# Patient Record
Sex: Female | Born: 1945 | Hispanic: Yes | Marital: Single
Health system: Southern US, Community
[De-identification: ages and names within clinical notes are randomized; demographics above are authoritative.]

---

## 2015-11-03 ENCOUNTER — Emergency Department: Payer: PRIVATE HEALTH INSURANCE

## 2015-11-03 ENCOUNTER — Emergency Department
Admission: EM | Admit: 2015-11-03 | Discharge: 2015-11-03 | Disposition: A | Discharge status: Home / Self Care | Payer: PRIVATE HEALTH INSURANCE | Attending: Emergency Medicine | Admitting: Emergency Medicine

## 2015-11-03 DIAGNOSIS — R05 Cough: Secondary | ICD-10-CM

## 2015-11-03 DIAGNOSIS — R739 Hyperglycemia, unspecified: Secondary | ICD-10-CM | POA: Insufficient documentation

## 2015-11-03 DIAGNOSIS — R059 Cough, unspecified: Secondary | ICD-10-CM

## 2015-11-03 LAB — COMPREHENSIVE METABOLIC PANEL
ALK PHOS: 85 U/L (ref 38–126)
ALT: 33 U/L (ref 14–54)
AST: 28 U/L (ref 15–41)
Albumin: 4.4 g/dL (ref 3.5–5.0)
Anion gap: 7 (ref 5–15)
BUN: 18 mg/dL (ref 6–20)
CALCIUM: 10.7 mg/dL — AB (ref 8.9–10.3)
CHLORIDE: 105 mmol/L (ref 101–111)
CO2: 27 mmol/L (ref 22–32)
CREATININE: 0.63 mg/dL (ref 0.44–1.00)
Glucose, Bld: 252 mg/dL — ABNORMAL HIGH (ref 65–99)
Potassium: 4.6 mmol/L (ref 3.5–5.1)
SODIUM: 139 mmol/L (ref 135–145)
Total Bilirubin: 0.7 mg/dL (ref 0.3–1.2)
Total Protein: 8.4 g/dL — ABNORMAL HIGH (ref 6.5–8.1)

## 2015-11-03 LAB — CBC
HCT: 40.6 % (ref 35.0–47.0)
HEMOGLOBIN: 14.2 g/dL (ref 12.0–16.0)
MCH: 30.7 pg (ref 26.0–34.0)
MCHC: 34.8 g/dL (ref 32.0–36.0)
MCV: 88.1 fL (ref 80.0–100.0)
PLATELETS: 256 10*3/uL (ref 150–440)
RBC: 4.62 MIL/uL (ref 3.80–5.20)
RDW: 12.8 % (ref 11.5–14.5)
WBC: 8 10*3/uL (ref 3.6–11.0)

## 2015-11-03 LAB — TROPONIN I: Troponin I: <0.03 ng/mL (ref <0.03)

## 2015-11-03 MED ORDER — HYDROCOD POLST-CPM POLST ER 10-8 MG/5ML PO SUER
5.0000 mL | Freq: Once | ORAL | Status: AC
  Administered 2015-11-03: 5 mL via ORAL
  Filled 2015-11-03: qty 5

## 2015-11-03 MED ORDER — AZITHROMYCIN 250 MG PO TABS: ORAL_TABLET | ORAL | Status: AC

## 2015-11-03 MED ORDER — HYDROCOD POLST-CPM POLST ER 10-8 MG/5ML PO SUER: 5.0000 mL | Freq: Two times a day (BID) | ORAL | Status: AC

## 2015-11-03 MED ORDER — METFORMIN HCL 500 MG PO TABS: 500.0000 mg | ORAL_TABLET | Freq: Two times a day (BID) | ORAL | Status: AC

## 2015-11-03 NOTE — ED Provider Notes (Addendum)
[facility] Emergency Department Provider Note        Time seen: ----------------------------------------- 2:11 PM on 11/03/2015 -----------------------------------------    I have reviewed the triage vital signs and the nursing notes.   HISTORY  Chief Complaint Cough    HPI Sarah Mejia is a 70 y.o. female who presents to ER for persistent cough for the last 2-1/2 months. Patient reportedly just moved here from [place] she's been feeling sick since she arrived. She denies fever but has had cough and congestion. She's been taking Mucinex, occasionally has white sputum. Patient states it hurts to cough and breathe, reports congestion. She denies hemoptysis. She denies other complaints at this time.   No past medical history on file.  There are no active problems to display for this patient.   No past surgical history on file.  Allergies Review of patient's allergies indicates no known allergies.  Social History Social History  Substance Use Topics  . Smoking status: Not on file  . Smokeless tobacco: Not on file  . Alcohol Use: Not on file    Review of Systems Constitutional: Negative for fever. Eyes: Negative for visual changes. ENT: Negative for sore throat.Positive for congestion Cardiovascular: Negative for chest pain. Respiratory: Positive for cough Gastrointestinal: Negative for abdominal pain, vomiting and diarrhea. Genitourinary: Negative for dysuria. Musculoskeletal: Negative for back pain. Skin: Negative for rash. Neurological: Negative for headaches, focal weakness or numbness.  10-point ROS otherwise negative.  ____________________________________________   PHYSICAL EXAM:  VITAL SIGNS: ED Triage Vitals  Enc Vitals Group     BP 11/03/15 1121 147/93 mmHg     Pulse Rate 11/03/15 1121 99     Resp 11/03/15 1121 20     Temp 11/03/15 1121 98.1 F (36.7 C)     Temp Source 11/03/15 1121 Oral     SpO2 11/03/15  1121 95 %     Weight 11/03/15 1121 112 lb 7 oz (51 kg)     Height 11/03/15 1121 4' 7.91" (1.42 m)     Head Cir --      Peak Flow --      Pain Score 11/03/15 1122 5     Pain Loc --      Pain Edu? --      Excl. in GC? --    Constitutional: Alert and oriented. Well appearing and in no distress. Eyes: Conjunctivae are normal. PERRL. Normal extraocular movements. ENT   Head: Normocephalic and atraumatic.   Nose: No congestion/rhinnorhea.   Mouth/Throat: Mucous membranes are moist.   Neck: No stridor. Cardiovascular: Normal rate, regular rhythm. No murmurs, rubs, or gallops. Respiratory: Normal respiratory effort without tachypnea nor retractions. Breath sounds are clear and equal bilaterally. No wheezes/rales/rhonchi. Gastrointestinal: Soft and nontender. Normal bowel sounds Musculoskeletal: Nontender with normal range of motion in all extremities. No lower extremity tenderness nor edema. Neurologic:  Normal speech and language. No gross focal neurologic deficits are appreciated.  Skin:  Skin is warm, dry and intact. No rash noted. Psychiatric: Mood and affect are normal. Speech and behavior are normal.  ____________________________________________  EKG: Interpreted by me. Sinus rhythm with occasional PVCs, rate is 93 bpm, normal PR interval, normal QRS, normal QT interval. Normal axis. No evidence of acute infarction.  ____________________________________________  ED COURSE:  Pertinent labs & imaging results that were available during my care of the patient were reviewed by me and considered in my medical decision making (see chart for details). Patient presents to ER with 2 months  of cough, we'll assess with basic labs and imaging. ____________________________________________    LABS (pertinent positives/negatives)  Labs Reviewed  COMPREHENSIVE METABOLIC PANEL - Abnormal; Notable for the following:    Glucose, Bld 252 (*)    Calcium 10.7 (*)    Total Protein 8.4  (*)    All other components within normal limits  CBC  TROPONIN I    RADIOLOGY  Chest x-ray IMPRESSION: No edema or consolidation. CT chest without contrast Is unremarkable IMPRESSION: 1. Diffuse bronchial wall thickening is identified. Additionally there is a patchy area of ground-glass attenuation within the posterior medial right lower lobe which may reflect an inflammatory or infectious process. 2. Aortic atherosclerosis and multi vessel coronary artery calcification. 3. Hepatic steatosis. ____________________________________________  FINAL ASSESSMENT AND PLAN  Cough, hyperglycemia  Plan: Patient with labs and imaging as dictated above. Patient presents with persistent cough, also found to have hyperglycemia here. Patient will be discharged with Tussionex and metformin. She'll be advised to have close outpatient follow-up with primary care and will be referred to [facility].   Emily Filbert, MD   Note: This dictation was prepared with Dragon dictation. Any transcriptional errors that result from this process are unintentional   Emily Filbert, MD 11/03/15 1553  Emily Filbert, MD 11/03/15 [phone]

## 2015-11-03 NOTE — ED Notes (Signed)
Pt has been here from [place] for 2 1/2 months, pt states that she started feeling sick after arrival, pt denies fever, states cough and congestion, pt has been taking otc mucinex, pt states sometimes is able to get her sputum up and it is white. Pt states chest hurts to cough and breathe, pt reports congestion

## 2015-11-03 NOTE — Discharge Instructions (Signed)
Tos en los adultos (Cough, Adult) La tos es un reflejo que limpia la garganta y las vas respiratorias, y ayuda a la curacin y Training and development officer de los pulmones. Es normal toser de Teacher, English as a foreign language, pero cuando esta se presenta con otros sntomas o dura mucho tiempo puede ser el signo de una enfermedad que [place]. La tos puede durar solo 2 o 3semanas (aguda) o ms de 8semanas (crnica). CAUSAS Comnmente, las causas de la tos son las siguientes:  Visual merchandiser sustancias que [employer].  Una infeccin respiratoria viral o bacteriana.  Alergias.  Asma.  Goteo posnasal.  Fumar.  El retroceso de cido estomacal hacia el esfago (reflujo gastroesofgico).  Algunos medicamentos.  Los problemas pulmonares crnicos, entre ellos, la enfermedad pulmonar obstructiva crnica (EPOC) (o, en contadas ocasiones, el cncer de pulmn).  Otras afecciones, como la insuficiencia cardaca. INSTRUCCIONES PARA EL CUIDADO EN EL HOGAR  Est atento a cualquier cambio en los sntomas. Tome estas medidas para Paramedic las molestias:  Tome los medicamentos solamente como se lo haya indicado el mdico.  Si le recetaron un antibitico, tmelo como se lo haya indicado el mdico. No deje de tomar los antibiticos aunque comience a sentirse mejor.  Hable con el mdico antes de tomar un antitusivo.  Beba suficiente lquido para Photographer orina clara o de color amarillo plido.  Si el aire est seco, use un vaporizador o un humidificador con vapor fro en su habitacin o en su casa para ayudar a aflojar las secreciones.  Evite todas las cosas que le producen tos en el trabajo o en su casa.  Si la tos aumenta durante la noche, intente dormir semisentado.  Evite el humo del cigarrillo. Si fuma, deje de hacerlo. Si necesita ayuda para dejar de fumar, consulte al mdico.  Evite la cafena.  Evite el alcohol.  Descanse todo lo que sea necesario. SOLICITE ATENCIN MDICA SI:   Aparecen nuevos  sntomas.  Expectora pus al toser.  La tos no mejora despus de 2 o 3semanas, o empeora.  No puede controlar la tos con antitusivos y no puede dormir bien.  Tiene un dolor que se intensifica o que no puede Sales promotion account executive.  Tiene fiebre.  Baja de peso sin causa aparente.  Tiene transpiracin nocturna. SOLICITE ATENCIN MDICA DE INMEDIATO SI:  Tose y escupe sangre.  Tiene dificultad para respirar.  Los latidos cardacos son muy rpidos.   Esta informacin no tiene Theme park manager el consejo del mdico. Asegrese de hacerle al mdico cualquier pregunta que tenga.   Document Released: 11/14/2010 Document Revised: 12/28/2014 Elsevier Interactive Patient Education 2016 [employer].  Hiperglucemia (Hyperglycemia) La hiperglucemia ocurre cuando la glucosa (azcar) en su sangre est demasiado elevada. Puede suceder por varias razones, pero a menudo ocurre en personas que no saben que tienen diabetes o no la controlan adecuadamente.  CAUSAS Tanto si tiene diabetes como si no, existen otras causas para la hiperglucemia. La hiperglucemia puede producirse cuando tiene diabetes, pero tambin puede presentarse en otras situaciones de las que podra no ser consciente, como por ejemplo: Diabetes  Si tiene diabetes y tiene problemas para controlar su glucosa en sangre, la hiperglucemia podra producirse debido a las siguientes razones:  No seguir Press photographer.  No tomar los medicamentos para la diabetes o tomarlos de forma inadecuada.  Realizar menos ejercicio del que normalmente hace.  Estar enfermo. Prediabetes  Esto no puede ignorarse. Antes de que la persona presente diabetes de tipo 2, casi siempre  hay "prediabetes". Esto ocurre cuando su glucosa en sangre es mayor que lo normal, pero no lo suficiente como para diagnosticar diabetes. La investigacin ha demostrado que algunos daos al cuerpo de Air cabin crewlargo plazo, en especial los del corazn y el sistema  circulatorio, podran haber ocurrido durante el periodo de prediabetes. Si controla la glucosa en sangre cuando tiene prediabetes, podr retardar o evitar que se desarrrolle la diabetes tipo 2. El estrs  Si tiene diabetes, deber hacer una dieta, tomar medicamentos orales o insulina para El Negrocontrolarla. Sin embargo, Clinical research associateencontrar que la glucosa en sangre es mayor que lo normal en el hospital tenga o no diabetes. Cientficamente se lo denomina "hiperglucemia por estrs". El estrs puede elevar su glucosa en sangre. Esto ocurre porque el organismo genera hormonas en los momentos de estrs. Si el estrs ha Loews Corporationsido la causa del alto nivel de glucosa en [place], Oregonel mdico podr Education officer, environmentalrealizar un seguimiento de [place]manera regular. Feliberto Hartse esta manera, podr asegurarse de que la hiperglucemia no empeora o progresa hacia diabetes. Esteroides  Los esteroides son medicamentos que actan en la infeccin que ataca al sistema inmunolgico para bloquear la inflamacin o la infeccin. Un efecto secundario puede ser el aumento de glucosa en [place]. Muchas personas pueden producir la suficiente insulina extra para este aumento, pero aquellos que no pueden, los esteroides pueden Group 1 Automotivehacer que los niveles sean an Madrasmayores. No es inusual que los tratamientos con esteorides "destapen" una diabetes que se est desarrollando. No siempre es posible determinar si la hiperglucemia desaparecer una vez que se detenga el consumo de esteroides. A veces se realiza un anlisis de sangre especial denominado A1c para determinar si la glucosa en sangre se ha elevado antes de comenzar con el consumo de esteroides. SNTOMAS  Sed.  Necesidad frecuente de Geographical information systems officerorinar.  M[employer] HoldingsBoca seca.  Visin borrosa.  Cansancio o fatiga.  Debilidad.  Somnolencia.  Hormigueo en el pie o pierna. DIAGNSTICO El diagnstico se realiza mediante el control de la glucosa en sangre de una o varias de las siguientes maneras:  Anlisis A1c. Es una sustancia qumica que se encuentra en la  [place].  Control de glucosa en sangre con tiras de prueba.  Resultados de laboratorio. TRATAMIENTO Primero, es importante conocer la causa de la hiperglucemia antes de tratarla. El tratamiento puede ser el siguiente, [place]unque pueden ser otros:  Educacin  Cambios o ajustes en los medicamentos.  Cambios o ajustes en el plan de alimentacin.  Tratamiento por enfermedades, infecciones, etc.  Control de glucosa en sangre ms frecuente.  Cambios en el plan de ejercicios.  Disminucin o interrupcin del consumo de esteroides.  Cambios en el estilo de vida. INSTRUCCIONES PARA EL CUIDADO DOMICILIARIO  Contrlese la glucosa en sangre, como se lo indicaron.  Haga ejercicios regularmente. El profesional que lo asiste le dar instrucciones relacionadas con el ejercicio fsico. La prediabetes que es consecuencia de situaciones de estrs, puede mejorar con la actividad fsica.  Consuma alimentos saludables y balanceados. Coma a menudo y de E[place]manera regular, en momentos fijos. El profesional o el nutricionista le dar una dieta especial para controlar su ingestin de azcar.  Mantener su peso ideal es importante. Si lo necesita, perder un poco de peso, como 5  7 Kg. puede ser beneficioso para AES Corporationmejorar los niveles de Production assistant, radioazcar en sangre. SOLICITE ATENCIN MDICA SI:  Tiene preguntas relacionadas con los medicamentos, la actividad o la dieta.  Contina teniendo sntomas (como mucha sed, deseos intensos de Geographical information systems officerorinar o aumento de peso) SOLICITE ATENCIN MDICA DE INMEDIATO SI:  Vomita  o tiene diarrea.  Su respiracin huele frutal.  La frecuencia respiratoria es ms rpida o ms lenta.  Est somnoliento o incoherente.  Siente adormecimiento, hormigueos o Tax adviser o en las manos.  Siente dolor en el pecho.  Sus sntomas empeoran aunque haya seguido las indicaciones de su mdico.  Tiene otras preguntas o preocupaciones.   Esta informacin no tiene Theme park manager el consejo del  mdico. Asegrese de hacerle al mdico cualquier pregunta que tenga.   Document Released: 04/08/2005 Document Revised: 07/01/2011 Elsevier Interactive Patient Education [employer]

## 2017-07-10 IMAGING — CT CT CHEST W/O CM
2 of 3 series · 17 of 46 positions shown, 19 images · non-contrast
Comparison: None

CLINICAL DATA: Fever, cough and congestion.

EXAM:
CT CHEST WITHOUT CONTRAST
TECHNIQUE: Multidetector CT imaging of the chest was performed following the
standard protocol without IV contrast.

[Series 2: routine chest wo · axial · 0.57mm/px · z∈[-610,-360]mm · 14 of 58 slices shown, 16 images]
[im 4/58  soft-tissue]
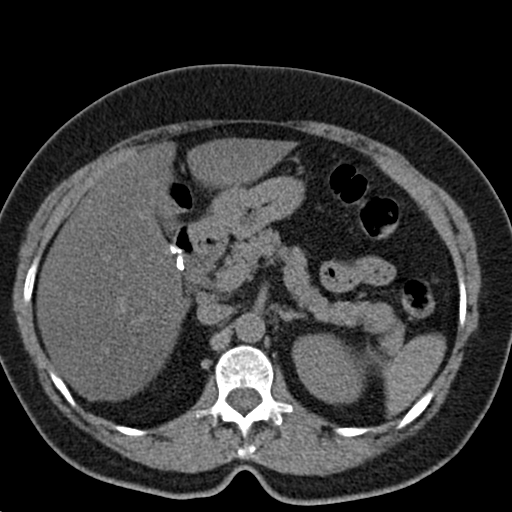
[im 4/58  bone]
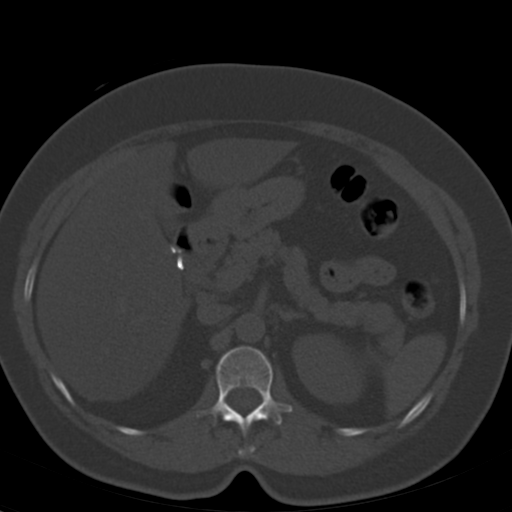
[im 8/58  soft-tissue]
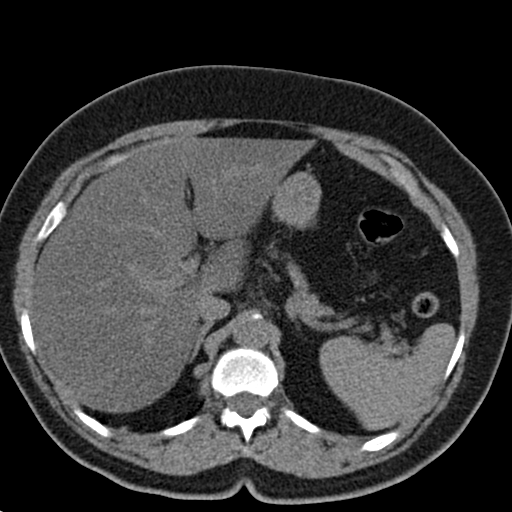
[im 12/58  soft-tissue]
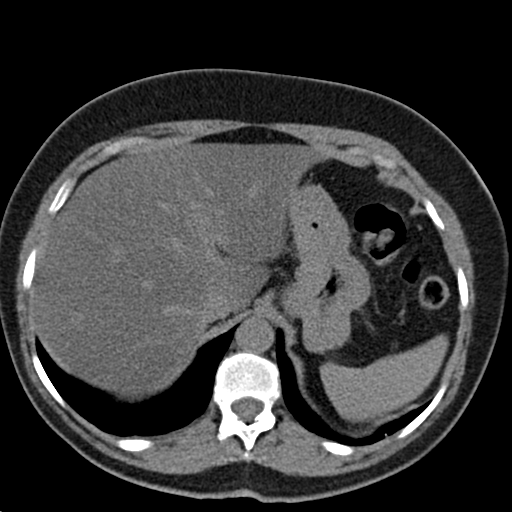
[im 15/58  soft-tissue]
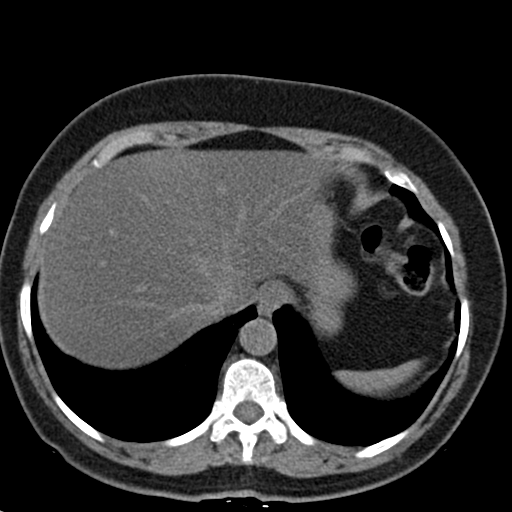
[im 19/58  soft-tissue]
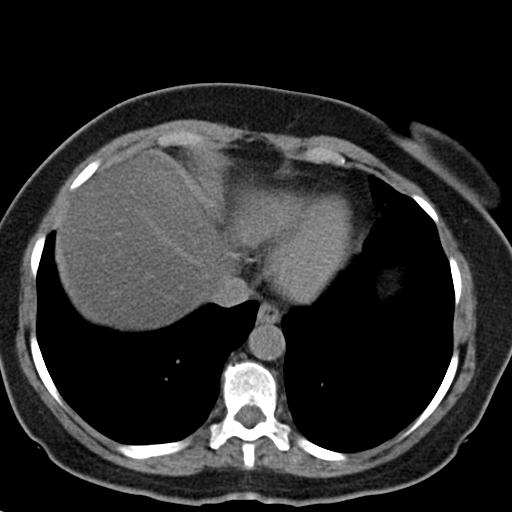
[im 23/58  soft-tissue]
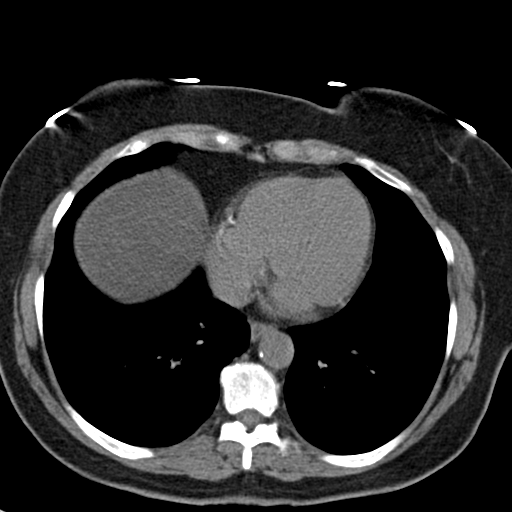
[im 26/58  soft-tissue]
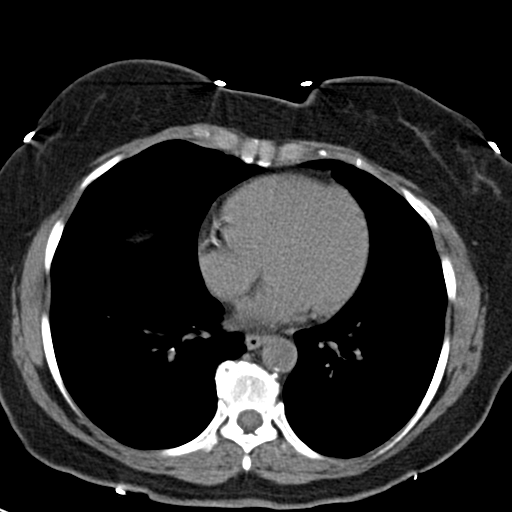
[im 32/58  soft-tissue]
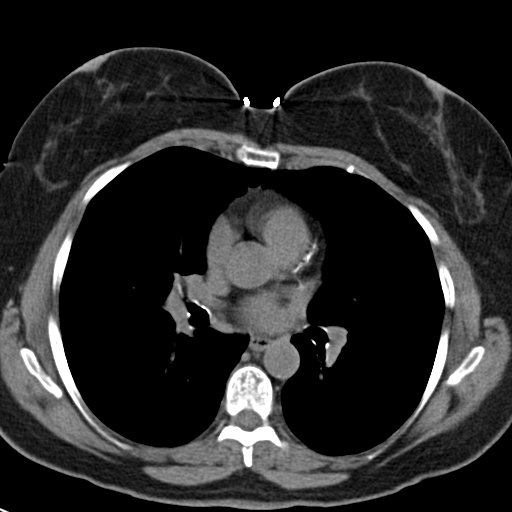
[im 35/58  soft-tissue]
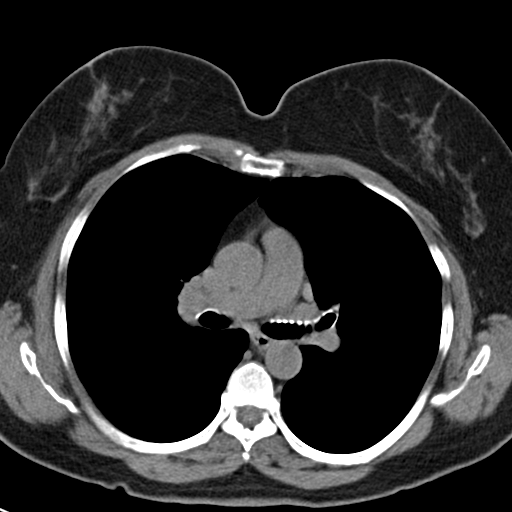
[im 35/58  bone]
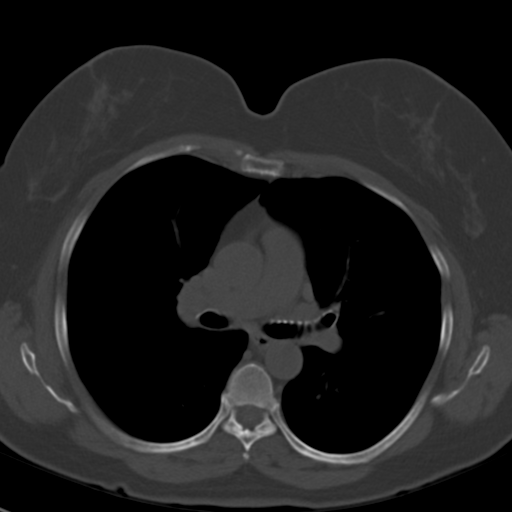
[im 39/58  soft-tissue]
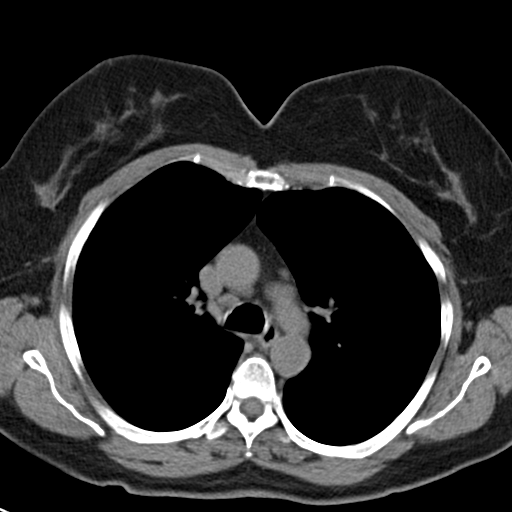
[im 43/58  soft-tissue]
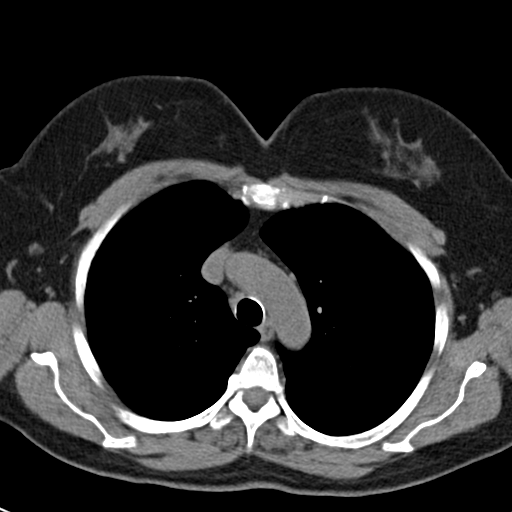
[im 46/58  soft-tissue]
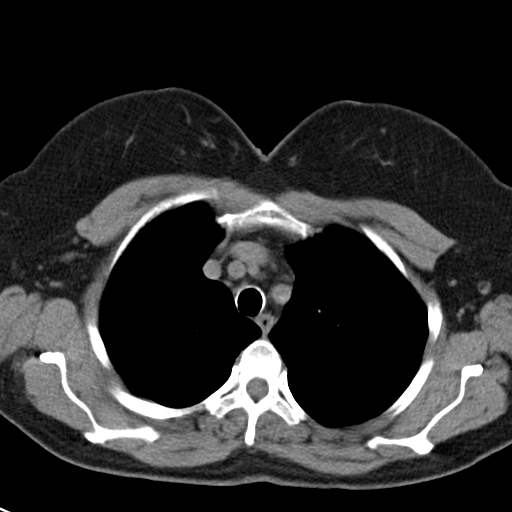
[im 50/58  soft-tissue]
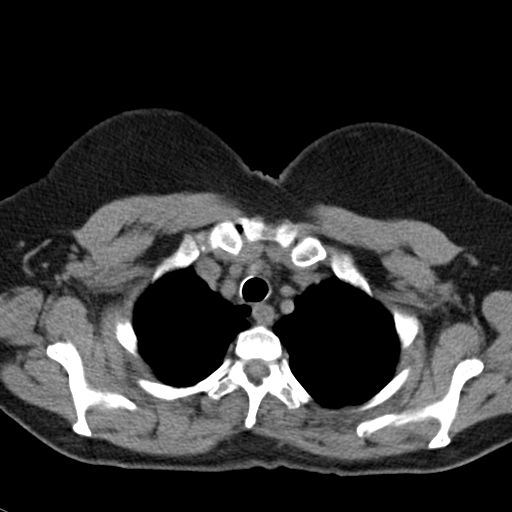
[im 54/58  soft-tissue]
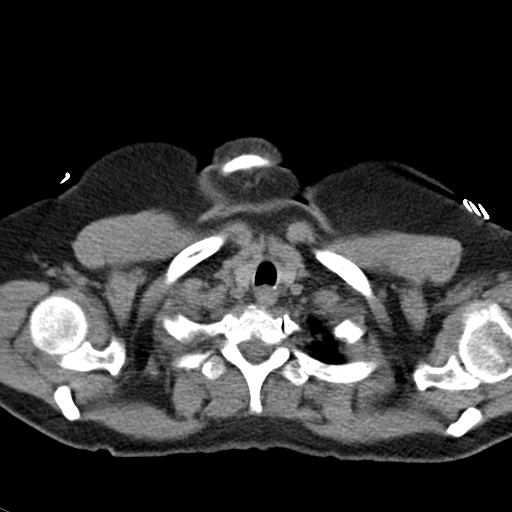

[Series 5: routine chest wo cor · coronal · 0.56mm/px · 3 of 123 slices shown]
[im 41/123  soft-tissue]
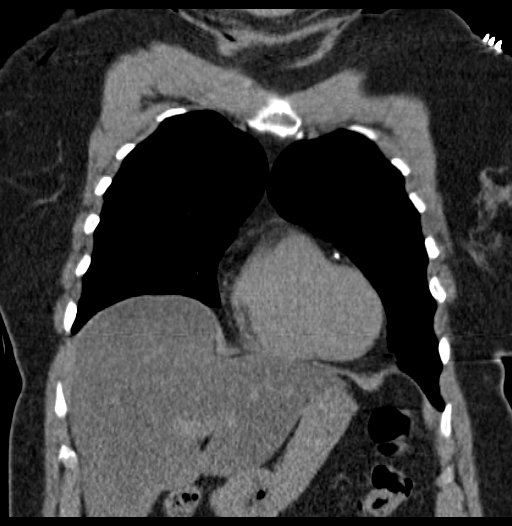
[im 55/123  soft-tissue]
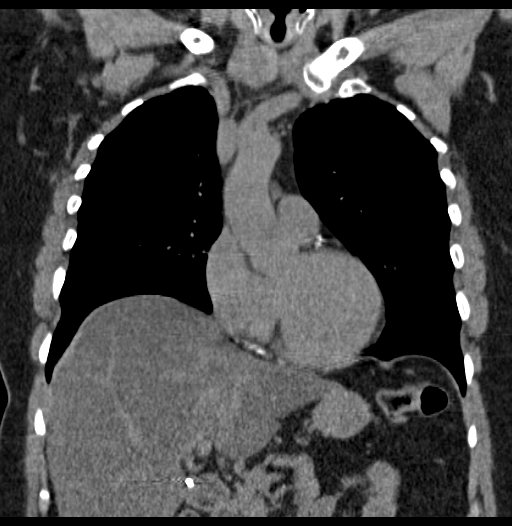
[im 68/123  soft-tissue]
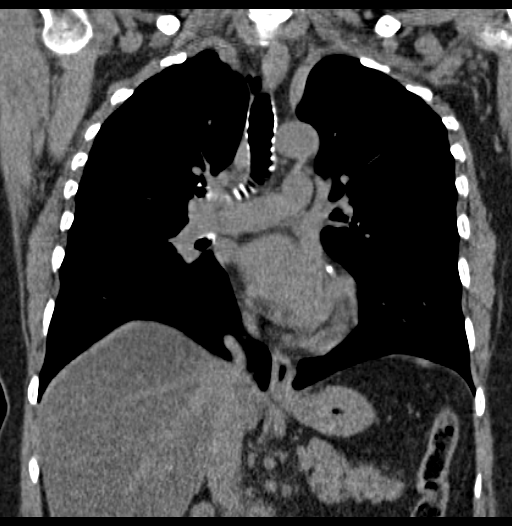

[17 of 46 positions shown; findings below may reference images not displayed]

FINDINGS: Cardiovascular: The heart size is normal. There is no pericardial
effusion identified. Aortic atherosclerosis noted. Calcification in
the RCA, LAD and left circumflex coronary artery noted.

Mediastinum/Nodes: Prominent mediastinal nodes are identified. Index
sub- carinal lymph node measures 0.8 cm, image number 29 of series
2. Low right paratracheal node measures 7 mm, image 20 of series 2.
No axillary or supraclavicular adenopathy.

Lungs/Pleura: There is mild diffuse bronchial wall thickening
identified. Patchy area of ground-glass attenuation within the
posterior medial left lower lobe identified, image 87 of series 3.
No airspace consolidation identified.

Upper Abdomen: Diffuse hepatic steatosis is identified. No focal
liver abnormality. Previous cholecystectomy. The visualized portions
of the adrenal glands, kidneys, spleen and pancreas are
unremarkable.

Musculoskeletal: There is degenerative disc disease noted within the
thoracic spine. There are no suspicious bone lesions identified.
IMPRESSION: 1. Diffuse bronchial wall thickening is identified. Additionally
there is a patchy area of ground-glass attenuation within the
posterior medial right lower lobe which may reflect an inflammatory
or infectious process.
2. Aortic atherosclerosis and multi vessel coronary artery
calcification.
3. Hepatic steatosis.
